# Patient Record
Sex: Male | Born: 2000 | Race: White | Hispanic: No | Marital: Single | Smoking: Never smoker
Health system: Southern US, Community
[De-identification: ages and names within clinical notes are randomized; demographics above are authoritative.]

## PROBLEM LIST (undated history)

## (undated) DIAGNOSIS — F329 Major depressive disorder, single episode, unspecified: Secondary | ICD-10-CM

## (undated) DIAGNOSIS — F909 Attention-deficit hyperactivity disorder, unspecified type: Secondary | ICD-10-CM

## (undated) DIAGNOSIS — F4310 Post-traumatic stress disorder, unspecified: Secondary | ICD-10-CM

## (undated) DIAGNOSIS — F32A Depression, unspecified: Secondary | ICD-10-CM

---
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
## 2020-08-28 ENCOUNTER — Emergency Department (HOSPITAL_BASED_OUTPATIENT_CLINIC_OR_DEPARTMENT_OTHER)
Admission: EM | Admit: 2020-08-28 | Discharge: 2020-08-28 | Disposition: A | Discharge status: Home / Self Care | Payer: BC Managed Care – PPO | Attending: Emergency Medicine | Admitting: Emergency Medicine

## 2020-08-28 ENCOUNTER — Other Ambulatory Visit: Payer: Self-pay

## 2020-08-28 ENCOUNTER — Encounter (HOSPITAL_BASED_OUTPATIENT_CLINIC_OR_DEPARTMENT_OTHER): Payer: Self-pay | Admitting: *Deleted

## 2020-08-28 ENCOUNTER — Emergency Department (HOSPITAL_BASED_OUTPATIENT_CLINIC_OR_DEPARTMENT_OTHER): Payer: BC Managed Care – PPO

## 2020-08-28 DIAGNOSIS — F419 Anxiety disorder, unspecified: Secondary | ICD-10-CM | POA: Insufficient documentation

## 2020-08-28 DIAGNOSIS — G43809 Other migraine, not intractable, without status migrainosus: Secondary | ICD-10-CM | POA: Insufficient documentation

## 2020-08-28 DIAGNOSIS — H53149 Visual discomfort, unspecified: Secondary | ICD-10-CM | POA: Diagnosis not present

## 2020-08-28 DIAGNOSIS — R519 Headache, unspecified: Secondary | ICD-10-CM | POA: Diagnosis present

## 2020-08-28 LAB — CBC WITH DIFFERENTIAL/PLATELET
Abs Immature Granulocytes: 0.01 10*3/uL (ref 0.00–0.07)
Basophils Absolute: 0 10*3/uL (ref 0.0–0.1)
Basophils Relative: 1 %
Eosinophils Absolute: 0.1 10*3/uL (ref 0.0–0.5)
Eosinophils Relative: 2 %
HCT: 42 % (ref 39.0–52.0)
Hemoglobin: 15.2 g/dL (ref 13.0–17.0)
Immature Granulocytes: 0 %
Lymphocytes Relative: 28 %
Lymphs Abs: 1.5 10*3/uL (ref 0.7–4.0)
MCH: 30 pg (ref 26.0–34.0)
MCHC: 36.2 g/dL — ABNORMAL HIGH (ref 30.0–36.0)
MCV: 83 fL (ref 80.0–100.0)
Monocytes Absolute: 0.4 10*3/uL (ref 0.1–1.0)
Monocytes Relative: 8 %
Neutro Abs: 3.4 10*3/uL (ref 1.7–7.7)
Neutrophils Relative %: 61 %
Platelets: 228 10*3/uL (ref 150–400)
RBC: 5.06 MIL/uL (ref 4.22–5.81)
RDW: 12.3 % (ref 11.5–15.5)
WBC: 5.4 10*3/uL (ref 4.0–10.5)
nRBC: 0 % (ref 0.0–0.2)

## 2020-08-28 LAB — PROTIME-INR
INR: 1.1 (ref 0.8–1.2)
Prothrombin Time: 14.7 seconds (ref 11.4–15.2)

## 2020-08-28 LAB — BASIC METABOLIC PANEL
Anion gap: 8 (ref 5–15)
BUN: 17 mg/dL (ref 6–20)
CO2: 23 mmol/L (ref 22–32)
Calcium: 9.6 mg/dL (ref 8.9–10.3)
Chloride: 104 mmol/L (ref 98–111)
Creatinine, Ser: 1.06 mg/dL (ref 0.61–1.24)
GFR, Estimated: >60 mL/min (ref >60)
Glucose, Bld: 99 mg/dL (ref 70–99)
Potassium: 3.6 mmol/L (ref 3.5–5.1)
Sodium: 135 mmol/L (ref 135–145)

## 2020-08-28 LAB — APTT: aPTT: 30 seconds (ref 24–36)

## 2020-08-28 MED ORDER — HYDROXYZINE HCL 25 MG PO TABS: 25.0000 mg | ORAL_TABLET | Freq: Four times a day (QID) | ORAL | 0 refills | Status: AC

## 2020-08-28 MED ORDER — LORAZEPAM 2 MG/ML IJ SOLN
1.0000 mg | Freq: Once | INTRAMUSCULAR | Status: AC
  Administered 2020-08-28: 1 mg via INTRAVENOUS
  Filled 2020-08-28: qty 1

## 2020-08-28 MED ORDER — PROCHLORPERAZINE EDISYLATE 10 MG/2ML IJ SOLN
10.0000 mg | Freq: Once | INTRAMUSCULAR | Status: AC
  Administered 2020-08-28: 10 mg via INTRAVENOUS
  Filled 2020-08-28: qty 2

## 2020-08-28 MED ORDER — BUTALBITAL-APAP-CAFFEINE 50-325-40 MG PO TABS: 1.0000 | ORAL_TABLET | Freq: Two times a day (BID) | ORAL | 0 refills | Status: AC | PRN

## 2020-08-28 MED ORDER — DIPHENHYDRAMINE HCL 50 MG/ML IJ SOLN
25.0000 mg | Freq: Once | INTRAMUSCULAR | Status: AC
  Administered 2020-08-28: 25 mg via INTRAVENOUS
  Filled 2020-08-28: qty 1

## 2020-08-28 MED ORDER — SODIUM CHLORIDE 0.9 % IV BOLUS
1000.0000 mL | Freq: Once | INTRAVENOUS | Status: AC
  Administered 2020-08-28: 1000 mL via INTRAVENOUS

## 2020-08-28 NOTE — ED Triage Notes (Signed)
He woke with a headache. Hx of migraines. Tingling in his right fingers. He came to the ER via EMS. He is ambulatory. He took his moms Nurtec that she takes for migraines.

## 2020-08-28 NOTE — ED Notes (Signed)
This RN called to room by CT tech who states patients mother said pt is having a panic attack. Pt restless, tachypnic and tremulous. Pt instructed on breathing techniques. Provider notified of anxiety, med ordered.

## 2020-08-28 NOTE — ED Provider Notes (Signed)
[facility]GENCY DEPARTMENT Provider Note   CSN: [id number] Arrival date & time: 08/28/20  1640    History Headache   Brian Ellis is a 20 y.o. male with past medical history significant for migraine, ADHD, depression who presents for evaluation of headache.  Initially woke up earlier today with a slight headache.  He felt like something was "off in my head."  He subsequently took a nap which lasted most of the day.  Woke up around 3 PM with some tingling to right fingertips.  He denies any numbness or weakness.  No visual field changes, neck pain, neck stiffness, difficulty with word finding.  Has a headache located to his right temporal region.  He has history of migraine with aura.  He is unsure if this feels similar. States typically has changes in vision with HA. Uses Goody powder for his HAs. His headache is described as aching. Some nausea without emesis. Has some photophobia, no phonophobia. Rates pain a 4/10.  Denies additional aggravating or alleviating factors. Feels anxious on exam. Tremulous to bilateral hands.   History obtained from patient and past medical records.  No interpreter used.  Vague timeline history with unclear at exact symptoms onset. Patient NOT a code stroke.  HPI     Past Medical History:  Diagnosis Date  . ADHD (attention deficit hyperactivity disorder)   . Depression   . Post traumatic stress disorder (PTSD)     There are no problems to display for this patient.   History reviewed. No pertinent surgical history.     No family history on file.  Social History   Tobacco Use  . Smoking status: Never Smoker  . Smokeless tobacco: Never Used  Substance Use Topics  . Alcohol use: No  . Drug use: Never    Home Medications Prior to Admission medications   Medication Sig Start Date End Date Taking? Authorizing Provider  butalbital-acetaminophen-caffeine (FIORICET) 50-325-40 MG tablet Take 1-2 tablets by mouth 2 (two) times  daily as needed for headache. 08/28/20 08/28/21 Yes Donnald Tabar A, PA-C  hydrOXYzine (ATARAX/VISTARIL) 25 MG tablet Take 1 tablet (25 mg total) by mouth every 6 (six) hours. 08/28/20  Yes Bernisha Verma A, PA-C  cloNIDine HCl (KAPVAY) 0.1 MG TB12 ER tablet Take 0.2 mg by mouth 2 (two) times daily.    [provider]  ibuprofen (ADVIL,MOTRIN) 200 MG tablet Take 200 mg by mouth every 6 (six) hours as needed for mild pain or moderate pain. Headache.    [provider]  lisdexamfetamine (VYVANSE) 70 MG capsule Take 70 mg by mouth every morning.    [provider]  QUEtiapine (SEROQUEL) 200 MG tablet Take 200 mg by mouth at bedtime.    [provider]  QUEtiapine (SEROQUEL) 50 MG tablet Take 50 mg by mouth every morning.    [provider]    Allergies    Aripiprazole and Other  Review of Systems   Review of Systems  Constitutional: Negative.   HENT: Negative.   Respiratory: Negative.   Cardiovascular: Negative.   Gastrointestinal: Negative.   Genitourinary: Negative.   Musculoskeletal: Negative.   Skin: Negative.   Neurological: Positive for headaches. Negative for dizziness, tremors, seizures, syncope, facial asymmetry, speech difficulty, weakness, light-headedness and numbness.       "tingling" to right distal finger tips  All other systems reviewed and are negative.   Physical Exam Updated Vital Signs BP (!) 106/44   Pulse 67   Temp 98.2  F (36.8 C) (Oral)   Resp 16   Ht 5\' 10"  (1.778 m)   Wt 83.9 kg   SpO2 99%   BMI 26.54 kg/m   Physical Exam Physical Exam  Constitutional: Pt is oriented to person, place, and time. Pt appears well-developed and well-nourished. No distress.  HENT:  Head: Normocephalic and atraumatic.  Mouth/Throat: Oropharynx is clear and moist.  Eyes: Conjunctivae and EOM are normal. Pupils are equal, round, and reactive to light. No scleral icterus.  No horizontal, vertical or rotational nystagmus   Neck: Normal range of motion. Neck supple.  Full active and passive ROM without pain No midline or paraspinal tenderness No nuchal rigidity or meningeal signs  Cardiovascular: Normal rate, regular rhythm and intact distal pulses.   Pulmonary/Chest: Effort normal and breath sounds normal. No respiratory distress. Pt has no wheezes. No rales.  Abdominal: Soft. Bowel sounds are normal. There is no tenderness. There is no rebound and no guarding.  Musculoskeletal: Normal range of motion.  Lymphadenopathy:    No cervical adenopathy.  Neurological: Pt. is alert and oriented to person, place, and time. He has normal reflexes. No cranial nerve deficit.  Exhibits normal muscle tone. Coordination normal.  Mental Status:  Alert, oriented, thought content appropriate. Speech fluent without evidence of aphasia. Able to follow 2 step commands without difficulty.  Cranial Nerves:  II:  Peripheral visual fields grossly normal, pupils equal, round, reactive to light III,IV, VI: ptosis not present, extra-ocular motions intact bilaterally  V,VII: smile symmetric, facial light touch sensation equal VIII: hearing grossly normal bilaterally  IX,X: midline uvula rise  XI: bilateral shoulder shrug equal and strong XII: midline tongue extension  Motor:  5/5 in upper and lower extremities bilaterally including strong and equal grip strength and dorsiflexion/plantar flexion Sensory: Pinprick and light touch normal in all extremities.  Deep Tendon Reflexes: 2+ and symmetric  Cerebellar: normal finger-to-nose with bilateral upper extremities Gait: normal gait and balance CV: distal pulses palpable throughout   Skin: Skin is warm and dry. No rash noted. Pt is not diaphoretic.  Psychiatric: Anxious affect. Denies SI, HI, AVH Nursing note and vitals reviewed. ED Results / Procedures / Treatments   Labs (all labs ordered are listed, but only abnormal results are displayed) Labs Reviewed  CBC WITH  DIFFERENTIAL/PLATELET - Abnormal; Notable for the following components:      Result Value   MCHC 36.2 (*)    All other components within normal limits  BASIC METABOLIC PANEL  PROTIME-INR  APTT    EKG None  Radiology CT Head Wo Contrast  Result Date: 08/28/2020 CLINICAL DATA:  Headache tingling EXAM: CT HEAD WITHOUT CONTRAST TECHNIQUE: Contiguous axial images were obtained from the base of the skull through the vertex without intravenous contrast. COMPARISON:  CT 01/09/2015 FINDINGS: Brain: No acute territorial infarction, hemorrhage, or intracranial mass. The ventricles are nonenlarged. Vascular: No hyperdense vessel or unexpected calcification. Skull: Normal. Negative for fracture or focal lesion. Sinuses/Orbits: Mucous retention cyst in the right maxillary sinus Other: None IMPRESSION: Negative non contrasted CT appearance of brain. Electronically Signed   By: Donavan Foil M.D.   On: 08/28/2020 18:05    Procedures Procedures   Medications Ordered in ED Medications  prochlorperazine (COMPAZINE) injection 10 mg (10 mg Intravenous Given 08/28/20 1724)  diphenhydrAMINE (BENADRYL) injection 25 mg (25 mg Intravenous Given 08/28/20 1724)  sodium chloride 0.9 % bolus 1,000 mL (0 mLs Intravenous Stopped 08/28/20 1836)  LORazepam (ATIVAN) injection 1 mg (1 mg Intravenous Given 08/28/20  1750)  LORazepam (ATIVAN) injection 1 mg (1 mg Intravenous Given 08/28/20 1843)    ED Course  I have reviewed the triage vital signs and the nursing notes.  Pertinent labs & imaging results that were available during my care of the patient were reviewed by me and considered in my medical decision making (see chart for details).  20 year old presents for evaluation of right-sided headache. Afebrile, non septic non ill appearing.  Felt "odd" in his head earlier today.  Subsequently took a nap when she developed tingling in his distal left fingertips.  He denies any paresthesias or weakness.  He has a nonfocal  neuro exam without deficits.  His heart and lungs are clear.  Abdomen soft, nontender.  He has diffuse tenderness to his right forehead.  Unclear timing of symptoms, whether this morning versus when he is nauseated.  He is not a code stroke.  He denies any sudden onset thunderclap headache.  No neck stiffness or rigidity.  Will attempt migraine cocktail, CT head and reassess.  Does have history of migraine with aura. Appear very anxious on exam. Tremulous on exam.  Labs and imaging personally reviewed and interpreted:  CBC without leukocytosis BMP without electrolyte, renal or liver abnormality CT head wo acute findings  Patient reassessed.  Mother states patient has been dealing with "Panic attacks" lately which have been cause similar symptoms. HA resolved with migraine cocktail. Atvian improved anxiety. No SI, HI, AVH.  Will dc home with atarax for anxiety. Mother is requesting Nurtec Rx for patients migraines. Previous followed by Neuro however due to financial constraints has not been back. Pt has been taking Mothers Nurtec with significant improvement in his Has.   Nursing states mother called out for "another panic attack" I went in room patient with tremor to bilateral hands. Does appear anxious on exam. Will order additional ativan.  Mother states patient is "Tolerant" to Benzo due to prior long history of Klonopin use. Not current on Benzos at home.  Patient reassessed. Arousal to voice. Symptoms earlier likely due to panic attack. No CP, SOB to suggest intrathoracic source of his anxiety feeling. No illicit drug or Etoh use.  Non focal neuro exam without deficits.  Mother now states that patient had had tingling to all 4 of his extremities earlier not just his unilateral finger tips.  Feels his earlier sx  likely due to his anxiety versus CVA, dissection or other acute intracranial abnormality.  Will DC home with close outpatient FU.  The patient has been appropriately medically  screened and/or stabilized in the ED. I have low suspicion for any other emergent medical condition which would require further screening, evaluation or treatment in the ED or require inpatient management.  Patient is hemodynamically stable and in no acute distress.  Patient able to ambulate in department prior to ED.  Evaluation does not show acute pathology that would require ongoing or additional emergent interventions while in the emergency department or further inpatient treatment.  I have discussed the diagnosis with the patient and answered all questions.  Pain is been managed while in the emergency department and patient has no further complaints prior to discharge.  Patient is comfortable with plan discussed in room and is stable for discharge at this time.  I have discussed strict return precautions for returning to the emergency department.  Patient was encouraged to follow-up with PCP/specialist refer to at discharge.    MDM Rules/Calculators/A&P  Final Clinical Impression(s) / ED Diagnoses Final diagnoses:  Other migraine without status migrainosus, not intractable  Anxiety    Rx / DC Orders ED Discharge Orders         Ordered    Ambulatory referral to Neurology       Comments: An appointment is requested in approximately: 2 weeks   08/28/20 1815    hydrOXYzine (ATARAX/VISTARIL) 25 MG tablet  Every 6 hours        08/28/20 1817    butalbital-acetaminophen-caffeine (FIORICET) 50-325-40 MG tablet  2 times daily PRN        08/28/20 1930           Maxwell Lemen A, PA-C 08/28/20 1944    Drenda Freeze, MD 08/28/20 [phone]

## 2020-08-28 NOTE — Discharge Instructions (Signed)
Take the medications as prescribed.  Follow up with Neurology  Return for new or worsening symptoms

## 2020-08-28 NOTE — ED Notes (Signed)
Pt continues to have anxiety, states headache has improved. Mother lying in bed with patient.

## 2020-08-28 NOTE — ED Notes (Signed)
Pt now resting comfortably, respirations even and unlabored. Resting with eyes closed. Mother at bedside

## 2020-11-21 ENCOUNTER — Emergency Department (HOSPITAL_COMMUNITY): Payer: BC Managed Care – PPO

## 2020-11-21 ENCOUNTER — Other Ambulatory Visit: Payer: Self-pay

## 2020-11-21 ENCOUNTER — Encounter (HOSPITAL_COMMUNITY): Payer: Self-pay | Admitting: Emergency Medicine

## 2020-11-21 ENCOUNTER — Emergency Department (HOSPITAL_COMMUNITY)
Admission: EM | Admit: 2020-11-21 | Discharge: 2020-11-21 | Disposition: A | Discharge status: Home / Self Care | Payer: BC Managed Care – PPO | Attending: Emergency Medicine | Admitting: Emergency Medicine

## 2020-11-21 DIAGNOSIS — R110 Nausea: Secondary | ICD-10-CM | POA: Insufficient documentation

## 2020-11-21 DIAGNOSIS — R109 Unspecified abdominal pain: Secondary | ICD-10-CM

## 2020-11-21 DIAGNOSIS — R1032 Left lower quadrant pain: Secondary | ICD-10-CM | POA: Diagnosis not present

## 2020-11-21 DIAGNOSIS — N50811 Right testicular pain: Secondary | ICD-10-CM | POA: Diagnosis not present

## 2020-11-21 DIAGNOSIS — R1031 Right lower quadrant pain: Secondary | ICD-10-CM | POA: Diagnosis not present

## 2020-11-21 DIAGNOSIS — R1084 Generalized abdominal pain: Secondary | ICD-10-CM | POA: Diagnosis not present

## 2020-11-21 DIAGNOSIS — N50812 Left testicular pain: Secondary | ICD-10-CM | POA: Diagnosis not present

## 2020-11-21 LAB — URINALYSIS, ROUTINE W REFLEX MICROSCOPIC
Bacteria, UA: NONE SEEN
Bilirubin Urine: NEGATIVE
Glucose, UA: NEGATIVE mg/dL
Ketones, ur: NEGATIVE mg/dL
Leukocytes,Ua: NEGATIVE
Nitrite: NEGATIVE
Protein, ur: NEGATIVE mg/dL
Specific Gravity, Urine: 1.006 (ref 1.005–1.030)
pH: 5 (ref 5.0–8.0)

## 2020-11-21 MED ORDER — FENTANYL CITRATE PF 50 MCG/ML IJ SOSY
100.0000 ug | PREFILLED_SYRINGE | Freq: Once | INTRAMUSCULAR | Status: AC
  Administered 2020-11-21: 100 ug via INTRAVENOUS
  Filled 2020-11-21: qty 2

## 2020-11-21 MED ORDER — KETOROLAC TROMETHAMINE 30 MG/ML IJ SOLN
30.0000 mg | Freq: Once | INTRAMUSCULAR | Status: AC
  Administered 2020-11-21: 30 mg via INTRAVENOUS
  Filled 2020-11-21: qty 1

## 2020-11-21 MED ORDER — ONDANSETRON HCL 4 MG/2ML IJ SOLN
4.0000 mg | Freq: Once | INTRAMUSCULAR | Status: AC
  Administered 2020-11-21: 4 mg via INTRAVENOUS
  Filled 2020-11-21: qty 2

## 2020-11-21 NOTE — Discharge Instructions (Addendum)
  SEEK IMMEDIATE MEDICAL ATTENTION IF: The pain does not go away or becomes severe, particularly over the next 8-12 hours.  A temperature above 100.82F develops.  Repeated vomiting occurs (multiple episodes).  The pain becomes localized to portions of the abdomen. The right side could possibly be appendicitis. In an adult, the left lower portion of the abdomen could be colitis or diverticulitis.  Blood is being passed in stools or vomit (bright red or black tarry stools).  Return if you have any new testicle or scrotal pain Return also if you develop chest pain, difficulty breathing, dizziness or fainting, or become confused, poorly responsive, or inconsolable.

## 2020-11-21 NOTE — ED Provider Notes (Signed)
[facility] EMERGENCY DEPARTMENT Provider Note   CSN: [id number] Arrival date & time: 11/21/20  0358     History Chief Complaint  Patient presents with   Abdominal Pain   Testicle Pain    Brian Ellis is a 20 y.o. male.  The history is provided by the patient.  Abdominal Pain Pain location:  L flank, LLQ and RLQ Pain quality: sharp   Pain radiates to:  Scrotum Pain severity:  Severe Onset quality:  Sudden Duration:  1 hour Timing:  Constant Progression:  Worsening Chronicity:  New Relieved by:  Nothing Worsened by:  Nothing Associated symptoms: nausea   Associated symptoms: no chest pain, no diarrhea, no dysuria, no fever, no shortness of breath and no vomiting   Risk factors: has not had multiple surgeries   Testicle Pain Associated symptoms include abdominal pain. Pertinent negatives include no chest pain and no shortness of breath.  Patient reports about 1 hour ago he began having left flank, abdominal pain and bilateral testicle pain.  He reports the worst pain was in his flank and abdomen  he reports that it started soon after urinating.  He reports he did have sexual intercourse about 1 hour prior to the pain starting but no pain during intercourse.   No previous history of kidney stone Past Medical History:  Diagnosis Date   ADHD (attention deficit hyperactivity disorder)    Depression    Post traumatic stress disorder (PTSD)       Social History   Tobacco Use   Smoking status: Never   Smokeless tobacco: Never  Substance Use Topics   Alcohol use: No   Drug use: Never    Home Medications Prior to Admission medications   Medication Sig Start Date End Date Taking? Authorizing Provider  butalbital-acetaminophen-caffeine (FIORICET) 50-325-40 MG tablet Take 1-2 tablets by mouth 2 (two) times daily as needed for headache. 08/28/20 08/28/21  Henderly, Britni A, PA-C  cloNIDine HCl (KAPVAY) 0.1 MG TB12 ER tablet Take 0.2 mg by mouth 2 (two) times daily.     [provider]  hydrOXYzine (ATARAX/VISTARIL) 25 MG tablet Take 1 tablet (25 mg total) by mouth every 6 (six) hours. 08/28/20   Henderly, Britni A, PA-C  ibuprofen (ADVIL,MOTRIN) 200 MG tablet Take 200 mg by mouth every 6 (six) hours as needed for mild pain or moderate pain. Headache.    [provider]  lisdexamfetamine (VYVANSE) 70 MG capsule Take 70 mg by mouth every morning.    [provider]  QUEtiapine (SEROQUEL) 200 MG tablet Take 200 mg by mouth at bedtime.    [provider]  QUEtiapine (SEROQUEL) 50 MG tablet Take 50 mg by mouth every morning.    [provider]    Allergies    Aripiprazole and Other  Review of Systems   Review of Systems  Constitutional:  Negative for fever.  Respiratory:  Negative for shortness of breath.   Cardiovascular:  Negative for chest pain.  Gastrointestinal:  Positive for abdominal pain and nausea. Negative for diarrhea and vomiting.  Genitourinary:  Positive for flank pain and testicular pain. Negative for dysuria, penile discharge and scrotal swelling.  All other systems reviewed and are negative.  Physical Exam Updated Vital Signs BP 126/79   Pulse 67   Resp 20   Ht 1.829 m (6')   Wt 83.9 kg   SpO2 96%   BMI 25.09 kg/m   Physical Exam CONSTITUTIONAL: Well developed/well nourished, uncomfortable appearing HEAD: Normocephalic/atraumatic EYES: EOMI/PERRL  ENMT: Mucous membranes moist NECK: supple no meningeal signs SPINE/BACK:entire spine nontender CV: S1/S2 noted, no murmurs/rubs/gallops noted LUNGS: Lungs are clear to auscultation bilaterally, no apparent distress ABDOMEN: soft, mild diffuse lower tenderness, no rebound or guarding, bowel sounds noted throughout abdomen GU: Left cva tenderness, testicles descended bilaterally.  No scrotal erythema or abscess.  No hernia noted.  Minimal tenderness is noted to bilateral testicles.  No testicular masses noted. Chaperone Maci present for  exam NEURO: Pt is awake/alert/appropriate, moves all extremitiesx4.  No facial droop.   EXTREMITIES: pulses normal/equal, full ROM SKIN: warm, color normal PSYCH: no abnormalities of mood noted, alert and oriented to situation  ED Results / Procedures / Treatments   Labs (all labs ordered are listed, but only abnormal results are displayed) Labs Reviewed  URINALYSIS, ROUTINE W REFLEX MICROSCOPIC - Abnormal; Notable for the following components:      Result Value   Color, Urine STRAW (*)    Hgb urine dipstick SMALL (*)    All other components within normal limits    EKG None  Radiology CT Renal Stone Study  Result Date: 11/21/2020 CLINICAL DATA:  Flank pain with kidney stone suspected. Sudden onset lower abdominal and testicular pain EXAM: CT ABDOMEN AND PELVIS WITHOUT CONTRAST TECHNIQUE: Multidetector CT imaging of the abdomen and pelvis was performed following the standard protocol without IV contrast. COMPARISON:  None. FINDINGS: Lower chest:  No contributory findings. Hepatobiliary: No focal liver abnormality.No evidence of biliary obstruction or stone. Pancreas: Unremarkable. Spleen: Unremarkable. Adrenals/Urinary Tract: Negative adrenals. No hydronephrosis or stone. Unremarkable bladder. Stomach/Bowel:  No obstruction. No appendicitis. Vascular/Lymphatic: No acute vascular abnormality. No mass or adenopathy. Reproductive:No pathologic findings. Other: No ascites or pneumoperitoneum. Musculoskeletal: No acute abnormalities. IMPRESSION: No explanation for pain.  No hydronephrosis or urolithiasis. Electronically Signed   By: Monte Fantasia M.D.   On: 11/21/2020 05:42    Procedures Procedures   Medications Ordered in ED Medications  fentaNYL (SUBLIMAZE) injection 100 mcg (100 mcg Intravenous Given 11/21/20 0431)  ketorolac (TORADOL) 30 MG/ML injection 30 mg (30 mg Intravenous Given 11/21/20 0430)  ondansetron (ZOFRAN) injection 4 mg (4 mg Intravenous Given 11/21/20 0430)    ED  Course  I have reviewed the triage vital signs and the nursing notes.  Pertinent labs & imaging results that were available during my care of the patient were reviewed by me and considered in my medical decision making (see chart for details).    MDM Rules/Calculators/A&P                           4:21 AM Strong  suspicion this represents   ureteral colic.  No clinical exam findings at this time to suggest testicular torsion 4:47 AM Pt with some improvement but still with flank/abd pain Will proceed with CT imaging 5:37 AM Pt now asleep CT imaging pending at this time 5:56 AM On reassessment, patient reports pain is still resolved.  He walks around the room in no distress.  He denies any testicular pain. I personally reviewed CT imaging, and final report reveals no acute findings. Although the story was consistent with ureteral colic, no stone identified. Patient did have reported testicular pain, but genital exam was unremarkable as there were no findings for mass/infection/torsion on exam He reports testicle pain has resolved I did offer further testing which include scrotal ultrasound, but patient declines as he reports feeling back to baseline. This seems to be a reasonable plan.  We discussed strict return precautions Final Clinical Impression(s) / ED Diagnoses Final diagnoses:  Flank pain  Generalized abdominal pain    Rx / DC Orders ED Discharge Orders     None        Ripley Fraise, MD 11/21/20 [phone]

## 2020-11-21 NOTE — ED Notes (Signed)
ED Provider at bedside. 

## 2020-11-21 NOTE — ED Notes (Signed)
Patient transported to CT 

## 2020-11-21 NOTE — ED Triage Notes (Signed)
Pt c/o sudden lower abd pain and testicular pain that started about 2 hrs PTA.

## 2020-11-24 LAB — GC/CHLAMYDIA PROBE AMP (~~LOC~~) NOT AT ARMC
Chlamydia: NEGATIVE
Comment: NEGATIVE
Comment: NORMAL
Neisseria Gonorrhea: NEGATIVE

## 2021-12-23 IMAGING — CT CT HEAD W/O CM
3 series · 16 of 47 positions shown, 19 images · non-contrast
Comparison: CT 01/09/2015

CLINICAL DATA: Headache tingling

EXAM:
CT HEAD WITHOUT CONTRAST
TECHNIQUE: Contiguous axial images were obtained from the base of the skull
through the vertex without intravenous contrast.

[Series 3: head wo · axial · 0.44mm/px · z∈[+435,+575]mm · 10 of 34 slices shown, 13 images]
[im 3/34  brain]
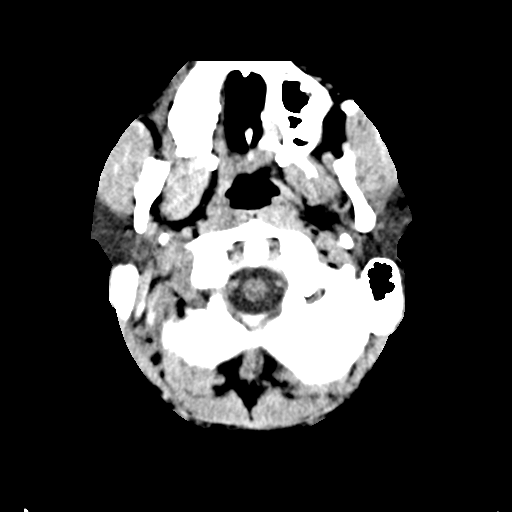
[im 3/34  bone]
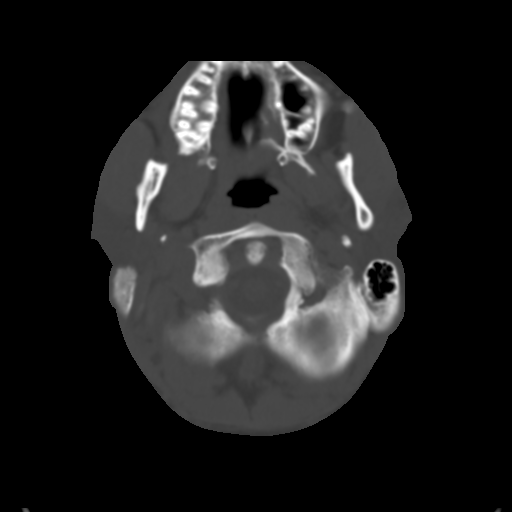
[im 6/34  brain]
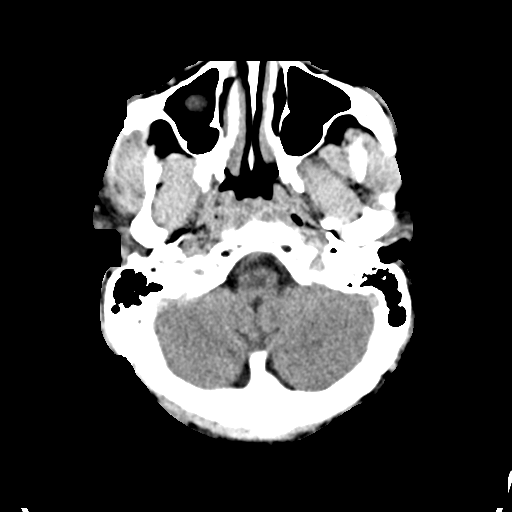
[im 10/34  brain]
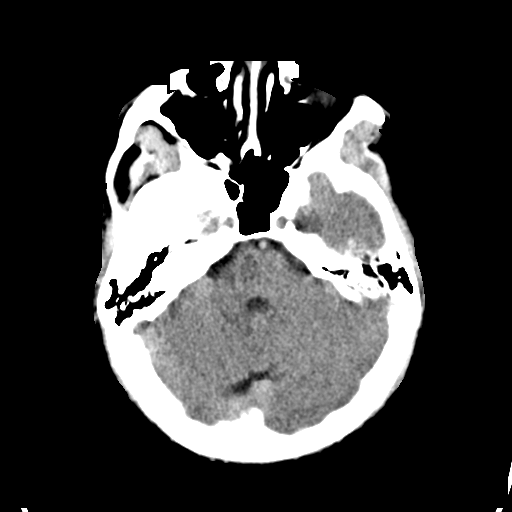
[im 12/34  brain]
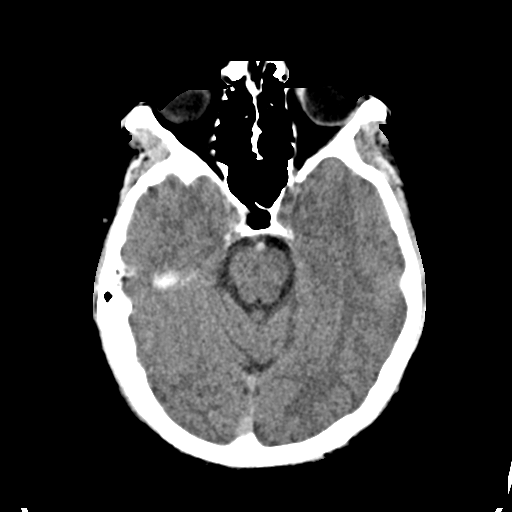
[im 15/34  brain]
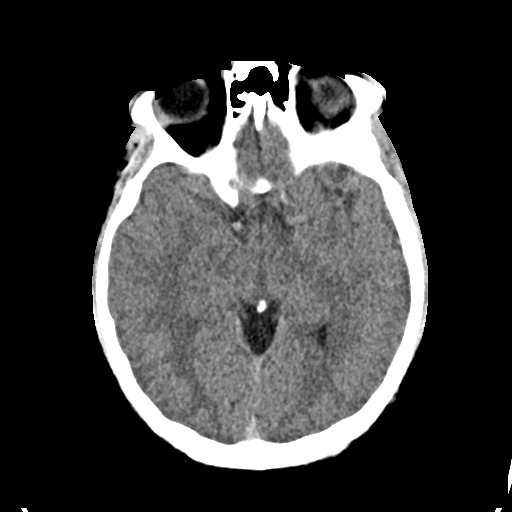
[im 15/34  bone]
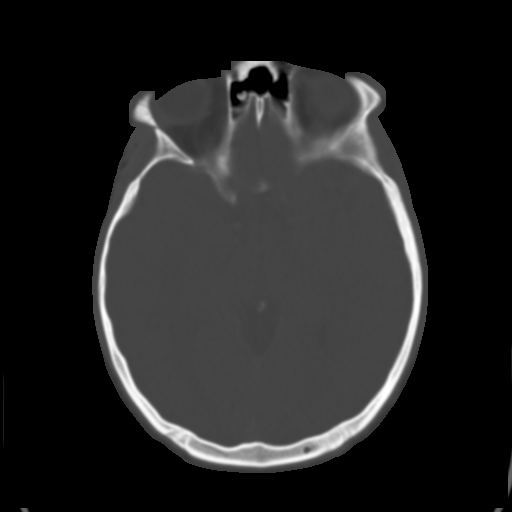
[im 19/34  brain]
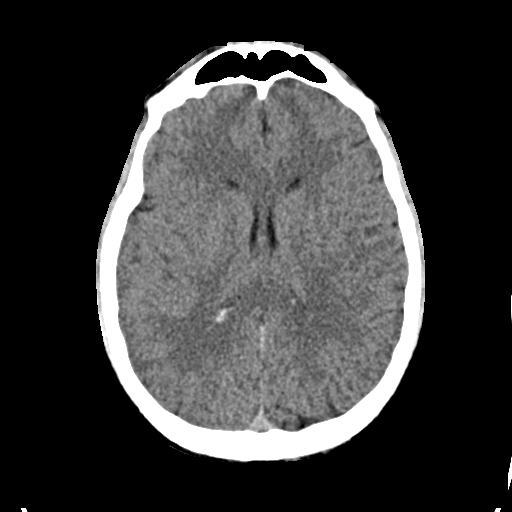
[im 22/34  brain]
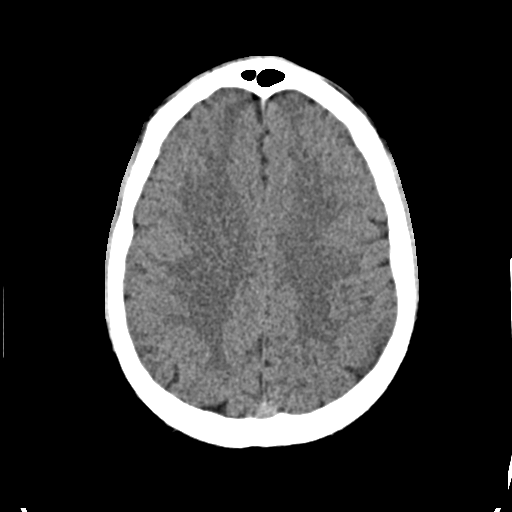
[im 26/34  brain]
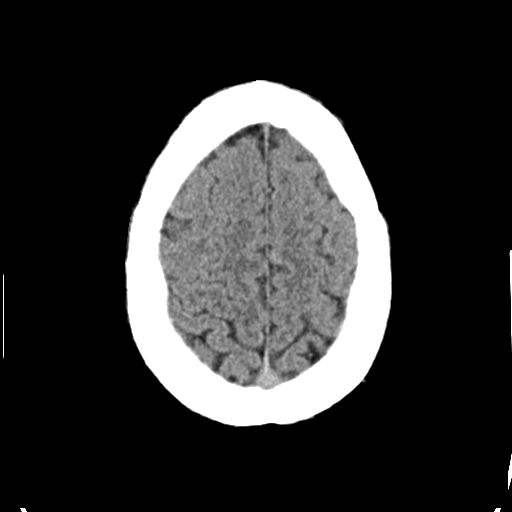
[im 28/34  brain]
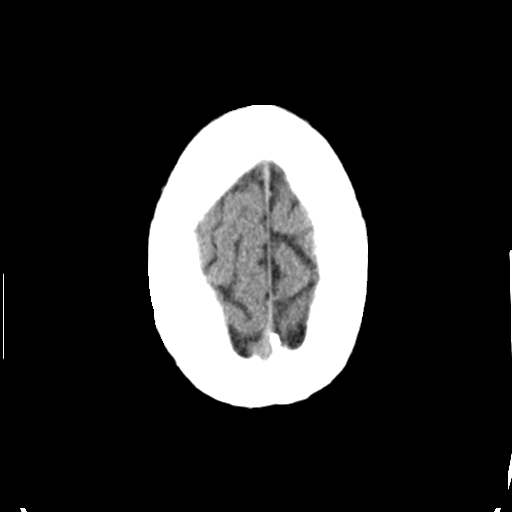
[im 28/34  bone]
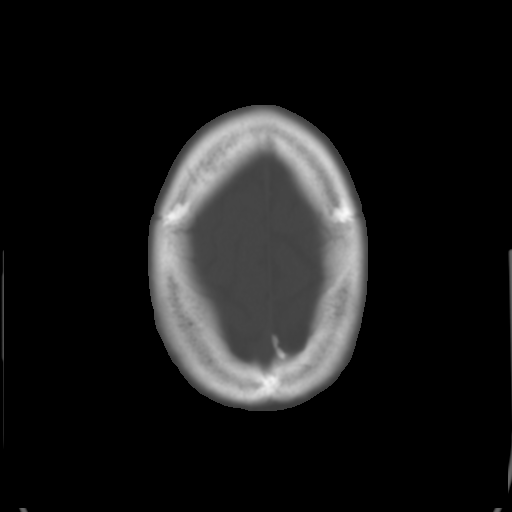
[im 31/34  brain]
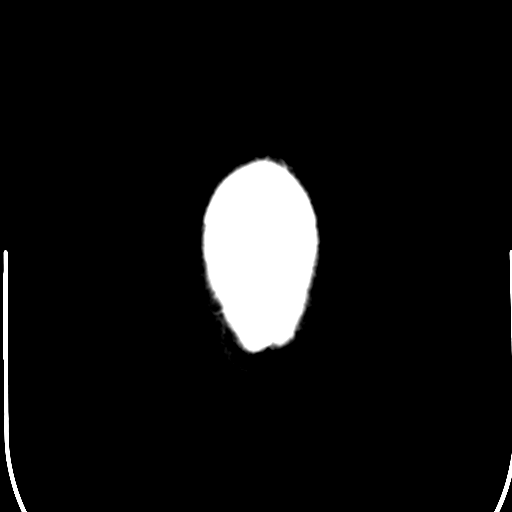

[Series 4: coronal soft · coronal · 0.33mm/px · 3 of 75 slices shown]
[im 25/75  brain]
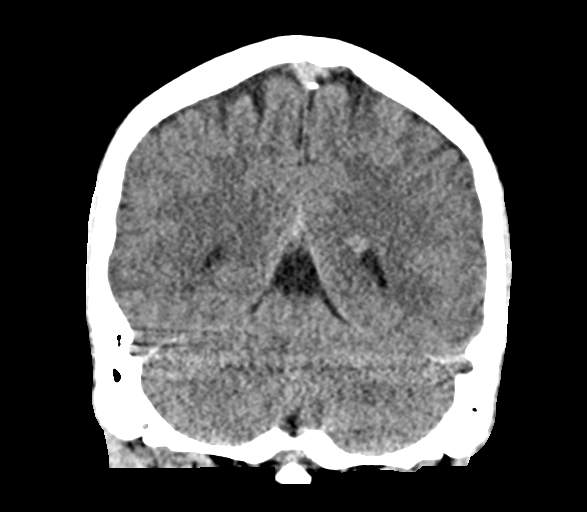
[im 33/75  brain]
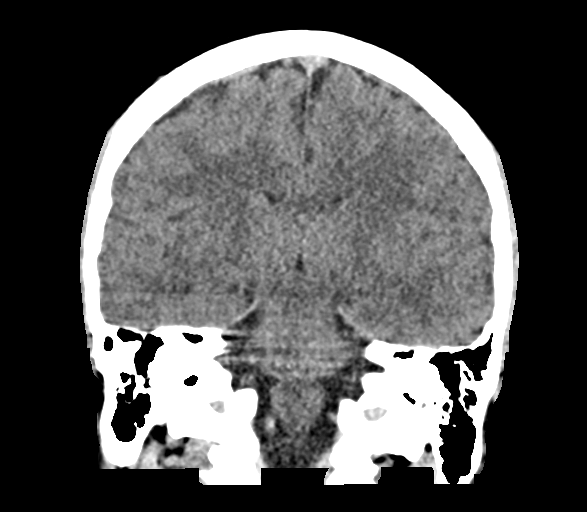
[im 42/75  brain]
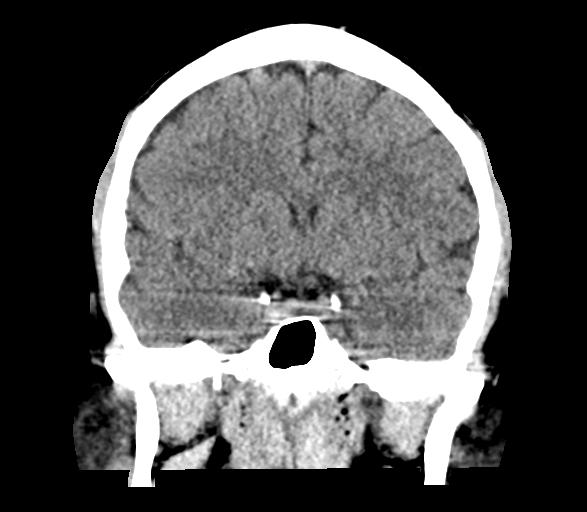

[Series 5: sag soft · sagittal · 0.33mm/px · 3 of 59 slices shown]
[im 20/59  brain]
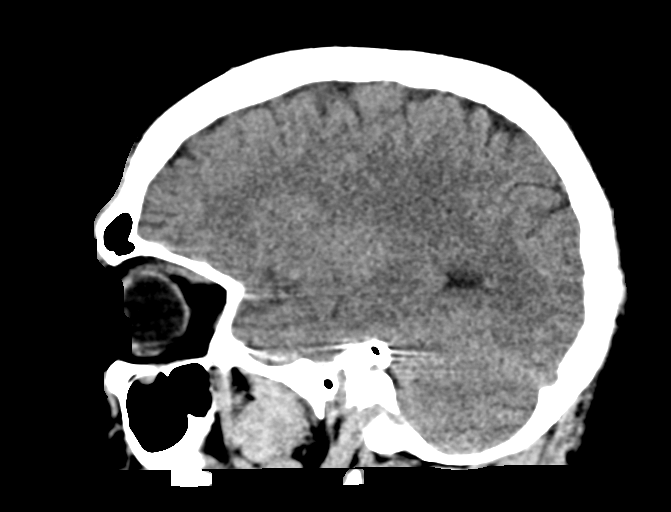
[im 30/59  brain]
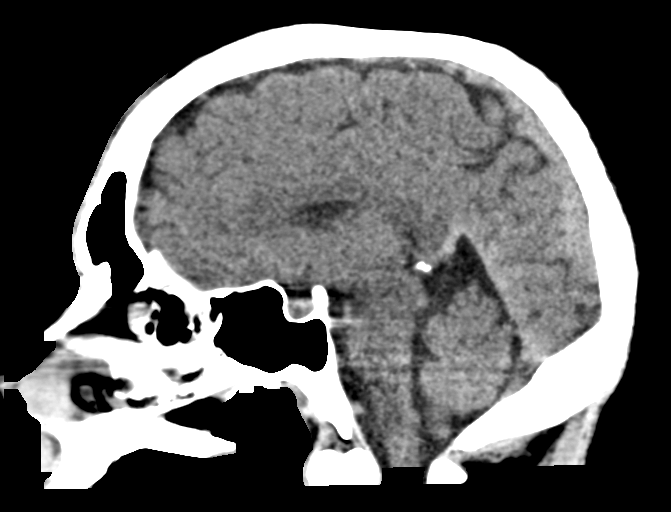
[im 39/59  brain]
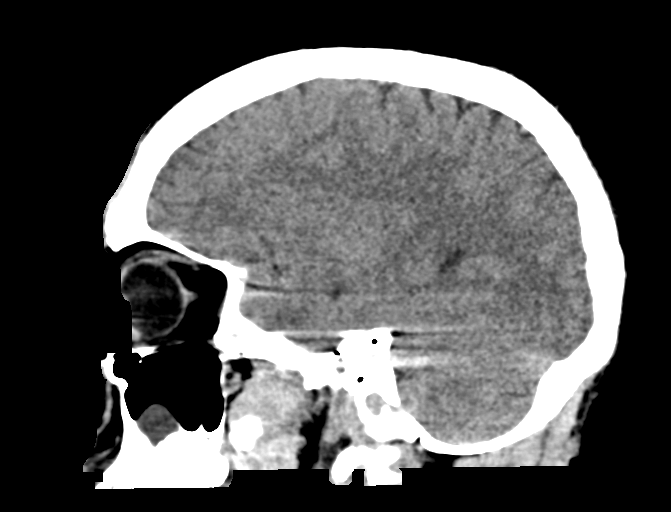

[16 of 47 positions shown; findings below may reference images not displayed]

FINDINGS: Brain: No acute territorial infarction, hemorrhage, or intracranial
mass. The ventricles are nonenlarged.

Vascular: No hyperdense vessel or unexpected calcification.

Skull: Normal. Negative for fracture or focal lesion.

Sinuses/Orbits: Mucous retention cyst in the right maxillary sinus

Other: None
IMPRESSION: Negative non contrasted CT appearance of brain.
